# Patient Record
Sex: Female | Born: 1980 | Hispanic: No | Marital: Single
Health system: Southern US, Community
[De-identification: ages and names within clinical notes are randomized; demographics above are authoritative.]

---
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
## 2011-09-03 ENCOUNTER — Other Ambulatory Visit (HOSPITAL_COMMUNITY)
Admission: RE | Admit: 2011-09-03 | Discharge: 2011-09-03 | Disposition: A | Discharge status: Home / Self Care | Payer: Self-pay | Source: Ambulatory Visit | Attending: Family Medicine | Admitting: Family Medicine

## 2011-09-03 DIAGNOSIS — Z124 Encounter for screening for malignant neoplasm of cervix: Secondary | ICD-10-CM | POA: Insufficient documentation

## 2011-09-03 DIAGNOSIS — Z1159 Encounter for screening for other viral diseases: Secondary | ICD-10-CM | POA: Insufficient documentation

## 2011-09-03 DIAGNOSIS — Z113 Encounter for screening for infections with a predominantly sexual mode of transmission: Secondary | ICD-10-CM | POA: Insufficient documentation

## 2014-01-21 ENCOUNTER — Other Ambulatory Visit: Payer: Self-pay | Admitting: Family Medicine

## 2014-01-21 ENCOUNTER — Other Ambulatory Visit (HOSPITAL_COMMUNITY)
Admission: RE | Admit: 2014-01-21 | Discharge: 2014-01-21 | Disposition: A | Discharge status: Home / Self Care | Payer: No Typology Code available for payment source | Source: Ambulatory Visit | Attending: Family Medicine | Admitting: Family Medicine

## 2014-01-21 DIAGNOSIS — Z124 Encounter for screening for malignant neoplasm of cervix: Secondary | ICD-10-CM | POA: Insufficient documentation

## 2014-02-08 ENCOUNTER — Other Ambulatory Visit: Payer: Self-pay | Admitting: Family Medicine

## 2014-02-08 ENCOUNTER — Ambulatory Visit
Admission: RE | Admit: 2014-02-08 | Discharge: 2014-02-08 | Disposition: A | Discharge status: Home / Self Care | Payer: No Typology Code available for payment source | Source: Ambulatory Visit | Attending: Family Medicine | Admitting: Family Medicine

## 2014-02-08 DIAGNOSIS — J45909 Unspecified asthma, uncomplicated: Secondary | ICD-10-CM

## 2018-04-22 ENCOUNTER — Other Ambulatory Visit: Payer: Self-pay | Admitting: Family Medicine

## 2018-04-22 ENCOUNTER — Ambulatory Visit
Admission: RE | Admit: 2018-04-22 | Discharge: 2018-04-22 | Disposition: A | Discharge status: Home / Self Care | Payer: No Typology Code available for payment source | Source: Ambulatory Visit | Attending: Family Medicine | Admitting: Family Medicine

## 2018-04-22 DIAGNOSIS — M25542 Pain in joints of left hand: Secondary | ICD-10-CM

## 2020-01-06 IMAGING — CR DG FINGER LITTLE 2+V*L*
3 series · 3 of 3 positions shown · non-contrast
Comparison: None.

CLINICAL DATA: Pain in the fifth digit

EXAM:
LEFT LITTLE FINGER 2+V

[x finger pa left]
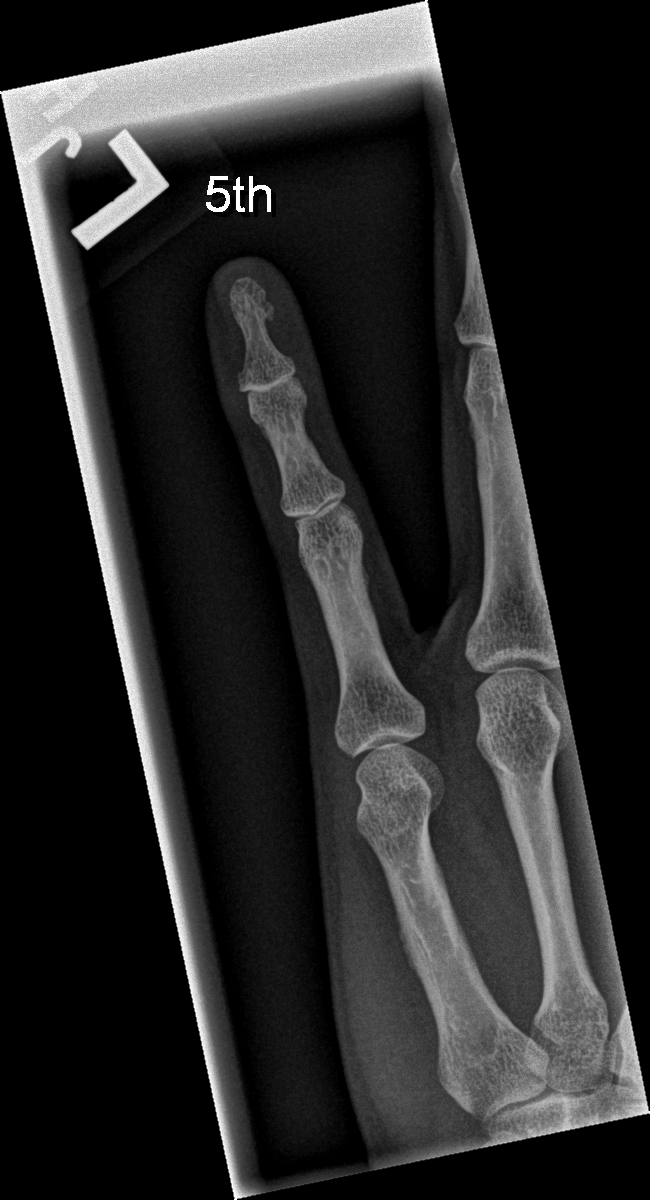

[x finger obl left]
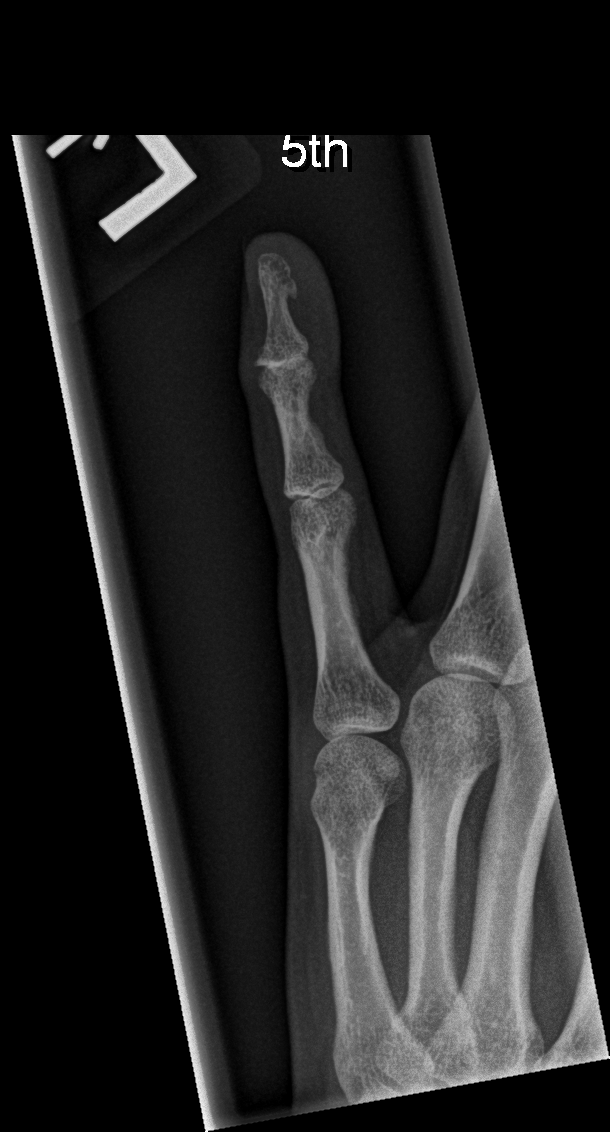

[x finger lat left]
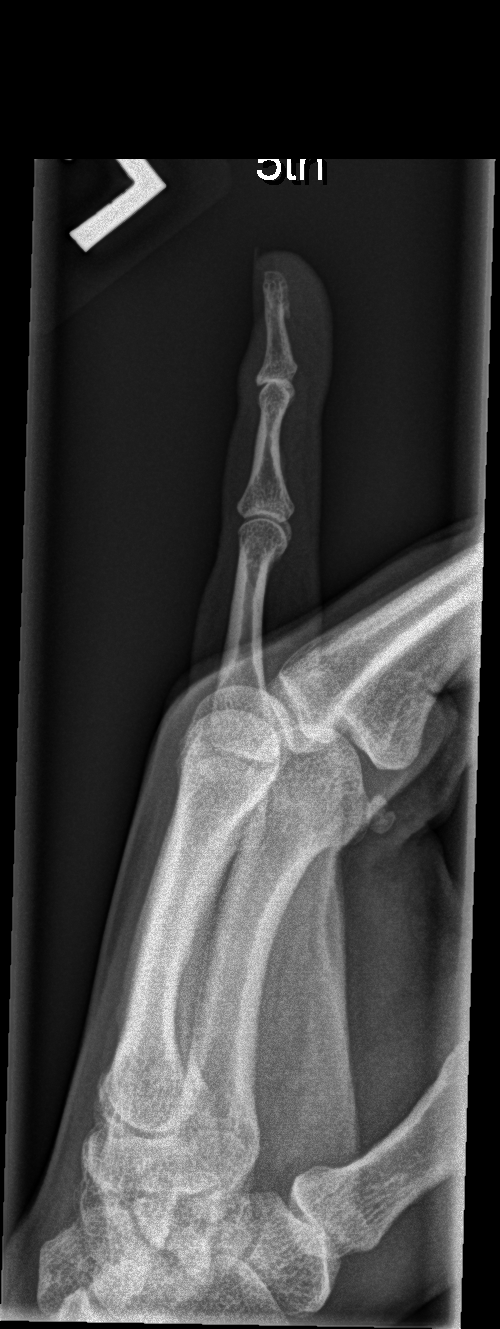

[3 of 3 positions shown; findings below may reference images not displayed]

FINDINGS: No fracture or malalignment. Moderate joint space narrowing at the
DIP joint. No erosion.
IMPRESSION: Arthritis at the fifth DIP joint.  No acute osseous abnormality.

## 2020-11-22 ENCOUNTER — Other Ambulatory Visit (HOSPITAL_COMMUNITY)
Admission: RE | Admit: 2020-11-22 | Discharge: 2020-11-22 | Disposition: A | Discharge status: Home / Self Care | Payer: Self-pay | Source: Ambulatory Visit | Attending: Family Medicine | Admitting: Family Medicine

## 2020-11-22 ENCOUNTER — Other Ambulatory Visit: Payer: Self-pay | Admitting: Family Medicine

## 2020-11-22 DIAGNOSIS — Z124 Encounter for screening for malignant neoplasm of cervix: Secondary | ICD-10-CM | POA: Insufficient documentation

## 2020-11-23 LAB — CYTOLOGY - PAP
Comment: NEGATIVE
Diagnosis: NEGATIVE
High risk HPV: NEGATIVE
